# Patient Record
Sex: Female | Born: 1985 | Race: White | Hispanic: No | Marital: Single | Smoking: Former smoker
Health system: Southern US, Community
[De-identification: ages and names within clinical notes are randomized; demographics above are authoritative.]

---
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
## 2017-02-24 ENCOUNTER — Emergency Department (HOSPITAL_COMMUNITY): Payer: Self-pay

## 2017-02-24 ENCOUNTER — Encounter (HOSPITAL_COMMUNITY): Payer: Self-pay | Admitting: Emergency Medicine

## 2017-02-24 ENCOUNTER — Emergency Department (HOSPITAL_COMMUNITY)
Admission: EM | Admit: 2017-02-24 | Discharge: 2017-02-24 | Disposition: A | Discharge status: Home / Self Care | Payer: Self-pay | Attending: Emergency Medicine | Admitting: Emergency Medicine

## 2017-02-24 DIAGNOSIS — N739 Female pelvic inflammatory disease, unspecified: Secondary | ICD-10-CM | POA: Insufficient documentation

## 2017-02-24 DIAGNOSIS — N730 Acute parametritis and pelvic cellulitis: Secondary | ICD-10-CM

## 2017-02-24 DIAGNOSIS — Z87891 Personal history of nicotine dependence: Secondary | ICD-10-CM | POA: Insufficient documentation

## 2017-02-24 LAB — COMPREHENSIVE METABOLIC PANEL
ALT: 22 U/L (ref 14–54)
ANION GAP: 6 (ref 5–15)
AST: 20 U/L (ref 15–41)
Albumin: 3.9 g/dL (ref 3.5–5.0)
Alkaline Phosphatase: 57 U/L (ref 38–126)
BILIRUBIN TOTAL: 0.3 mg/dL (ref 0.3–1.2)
BUN: 11 mg/dL (ref 6–20)
CALCIUM: 9.1 mg/dL (ref 8.9–10.3)
CO2: 29 mmol/L (ref 22–32)
Chloride: 103 mmol/L (ref 101–111)
Creatinine, Ser: 0.81 mg/dL (ref 0.44–1.00)
GFR calc non Af Amer: >60 mL/min (ref >60)
Glucose, Bld: 106 mg/dL — ABNORMAL HIGH (ref 65–99)
POTASSIUM: 3.9 mmol/L (ref 3.5–5.1)
SODIUM: 138 mmol/L (ref 135–145)
TOTAL PROTEIN: 7.1 g/dL (ref 6.5–8.1)

## 2017-02-24 LAB — WET PREP, GENITAL
Sperm: NONE SEEN
Trich, Wet Prep: NONE SEEN
Yeast Wet Prep HPF POC: NONE SEEN

## 2017-02-24 LAB — CBC
HCT: 40.1 % (ref 36.0–46.0)
HEMOGLOBIN: 13 g/dL (ref 12.0–15.0)
MCH: 30.1 pg (ref 26.0–34.0)
MCHC: 32.4 g/dL (ref 30.0–36.0)
MCV: 92.8 fL (ref 78.0–100.0)
Platelets: 320 10*3/uL (ref 150–400)
RBC: 4.32 MIL/uL (ref 3.87–5.11)
RDW: 12.2 % (ref 11.5–15.5)
WBC: 11.6 10*3/uL — ABNORMAL HIGH (ref 4.0–10.5)

## 2017-02-24 LAB — URINALYSIS, ROUTINE W REFLEX MICROSCOPIC
BILIRUBIN URINE: NEGATIVE
GLUCOSE, UA: NEGATIVE mg/dL
KETONES UR: NEGATIVE mg/dL
LEUKOCYTES UA: NEGATIVE
NITRITE: NEGATIVE
PROTEIN: NEGATIVE mg/dL
Specific Gravity, Urine: 1.011 (ref 1.005–1.030)
pH: 8 (ref 5.0–8.0)

## 2017-02-24 LAB — LIPASE, BLOOD: Lipase: 27 U/L (ref 11–51)

## 2017-02-24 LAB — POC URINE PREG, ED: Preg Test, Ur: NEGATIVE

## 2017-02-24 MED ORDER — METRONIDAZOLE 500 MG PO TABS
500.0000 mg | ORAL_TABLET | Freq: Once | ORAL | Status: AC
  Administered 2017-02-24: 500 mg via ORAL
  Filled 2017-02-24: qty 1

## 2017-02-24 MED ORDER — DOXYCYCLINE HYCLATE 100 MG PO TABS
100.0000 mg | ORAL_TABLET | Freq: Once | ORAL | Status: AC
  Administered 2017-02-24: 100 mg via ORAL
  Filled 2017-02-24: qty 1

## 2017-02-24 MED ORDER — METRONIDAZOLE 500 MG PO TABS: 500.0000 mg | ORAL_TABLET | Freq: Two times a day (BID) | ORAL | 0 refills | Status: AC

## 2017-02-24 MED ORDER — NAPROXEN 500 MG PO TABS: 500.0000 mg | ORAL_TABLET | Freq: Two times a day (BID) | ORAL | 0 refills | Status: AC

## 2017-02-24 MED ORDER — KETOROLAC TROMETHAMINE 30 MG/ML IJ SOLN
30.0000 mg | Freq: Once | INTRAMUSCULAR | Status: AC
  Administered 2017-02-24: 30 mg via INTRAVENOUS
  Filled 2017-02-24: qty 1

## 2017-02-24 MED ORDER — LIDOCAINE HCL (PF) 1 % IJ SOLN
INTRAMUSCULAR | Status: AC
  Administered 2017-02-24: 1 mL
  Filled 2017-02-24: qty 5

## 2017-02-24 MED ORDER — CEFTRIAXONE SODIUM 250 MG IJ SOLR
250.0000 mg | Freq: Once | INTRAMUSCULAR | Status: AC
  Administered 2017-02-24: 250 mg via INTRAMUSCULAR
  Filled 2017-02-24: qty 250

## 2017-02-24 MED ORDER — DOXYCYCLINE HYCLATE 100 MG PO CAPS: 100.0000 mg | ORAL_CAPSULE | Freq: Two times a day (BID) | ORAL | 0 refills | Status: AC

## 2017-02-24 NOTE — ED Triage Notes (Addendum)
Patient c/o lower abd pain x3 weeks. Per patient nausea and vomiting. Denies any fevers, vaginal bleeding, or diarrhea. Patient does report frequent urination. Per patient last BM this morning-no blood noted. Per patient just had menstruation in which she states "was more spotting." Patient does state that she is sexual active.

## 2017-02-24 NOTE — ED Provider Notes (Signed)
[facility] DEPT Provider Note   CSN: [id number] Arrival date & time: 02/24/17  1145     History   Chief Complaint Chief Complaint  Patient presents with  . Abdominal Pain    HPI Karen Dunlap is a 31 y.o. female.  HPI  31 year old female presents with a chief complaint of abdominal pain. She states his been going on for 3 weeks. The pain seems to come and go and is severe and sharp when it occurs. It is mostly lower abdominal that she has a hard time localizing it. Some nausea but no vomiting. She denies any dysuria. She states a week after this started she developed some small spotting that was different than her typical menstrual cycle. She states she has irregular cycles. She has had a negative pregnancy test at [facility] as well as at home multiple times. No current vaginal bleeding or discharge. She also complains of chest pain that is diffuse with associated shortness of breath for the last 3 or 4 days. This is, on with a headache and dizziness as well.  History reviewed. No pertinent past medical history.  There are no active problems to display for this patient.   Past Surgical History:  Procedure Laterality Date  . CESAREAN SECTION  2008  . CYSTECTOMY  2008    OB History    No data available       Home Medications    Prior to Admission medications   Medication Sig Start Date End Date Taking? Authorizing Provider  doxycycline (VIBRAMYCIN) 100 MG capsule Take 1 capsule (100 mg total) by mouth 2 (two) times daily. 02/24/17 03/10/17  Pricilla Loveless, MD  metroNIDAZOLE (FLAGYL) 500 MG tablet Take 1 tablet (500 mg total) by mouth 2 (two) times daily. One po bid x 7 days 02/24/17   Pricilla Loveless, MD  naproxen (NAPROSYN) 500 MG tablet Take 1 tablet (500 mg total) by mouth 2 (two) times daily with a meal. 02/24/17   Pricilla Loveless, MD    Family History No family history on file.  Social History Social History  Substance Use Topics  . Smoking status:  Former Smoker    Quit date: 2016  . Smokeless tobacco: Never Used  . Alcohol use Yes     Allergies   Pineapple and Orange fruit [citrus]   Review of Systems Review of Systems  Constitutional: Negative for fever.  Respiratory: Positive for shortness of breath.   Cardiovascular: Positive for chest pain.  Gastrointestinal: Positive for abdominal pain. Negative for diarrhea and vomiting.  Genitourinary: Positive for menstrual problem. Negative for dysuria, vaginal bleeding, vaginal discharge and vaginal pain.  Neurological: Positive for dizziness and headaches.  All other systems reviewed and are negative.    Physical Exam Updated Vital Signs BP 112/78   Pulse (!) 58   Temp 97.9 F (36.6 C) (Oral)   Resp 18   Ht 5\' 1"  (1.549 m)   Wt 79.4 kg (175 lb)   LMP 02/14/2017   SpO2 99%   BMI 33.07 kg/m   Physical Exam  Constitutional: She is oriented to person, place, and time. She appears well-developed and well-nourished. No distress.  HENT:  Head: Normocephalic and atraumatic.  Right Ear: External ear normal.  Left Ear: External ear normal.  Nose: Nose normal.  Eyes: Right eye exhibits no discharge. Left eye exhibits no discharge.  Cardiovascular: Normal rate, regular rhythm and normal heart sounds.   Pulmonary/Chest: Effort normal and breath sounds normal. She exhibits tenderness (superior/anterior).  Abdominal:  Soft. There is tenderness in the right lower quadrant, suprapubic area and left lower quadrant.  Genitourinary: Uterus is tender. Vaginal discharge (mild, mixed with blood) found.  Genitourinary Comments: Mild vaginal discharge. Unable to visualize cervix on exam. Diffuse tenderness on bimanual exam, unable to feel ovaries.   Neurological: She is alert and oriented to person, place, and time.  Skin: Skin is warm and dry. She is not diaphoretic.  Nursing note and vitals reviewed.    ED Treatments / Results  Labs (all labs ordered are listed, but only abnormal  results are displayed) Labs Reviewed  WET PREP, GENITAL - Abnormal; Notable for the following:       Result Value   Clue Cells Wet Prep HPF POC PRESENT (*)    WBC, Wet Prep HPF POC FEW (*)    All other components within normal limits  COMPREHENSIVE METABOLIC PANEL - Abnormal; Notable for the following:    Glucose, Bld 106 (*)    All other components within normal limits  CBC - Abnormal; Notable for the following:    WBC 11.6 (*)    All other components within normal limits  URINALYSIS, ROUTINE W REFLEX MICROSCOPIC - Abnormal; Notable for the following:    APPearance HAZY (*)    Hgb urine dipstick SMALL (*)    Bacteria, UA RARE (*)    Squamous Epithelial / LPF 0-5 (*)    All other components within normal limits  LIPASE, BLOOD  POC URINE PREG, ED  GC/CHLAMYDIA PROBE AMP ([employer]) NOT AT [facility]    EKG  EKG Interpretation  Date/Time:  Wednesday February 24 2017 13:19:00 EDT Ventricular Rate:  60 PR Interval:    QRS Duration: 78 QT Interval:  394 QTC Calculation: 394 R Axis:   45 Text Interpretation:  Sinus rhythm Low voltage, precordial leads No old tracing to compare Confirmed by Pricilla Loveless [phone]) on 02/24/2017 1:30:11 PM       Radiology Dg Chest 2 View  Result Date: 02/24/2017 CLINICAL DATA:  Abdominal pain with nausea and vomiting for 3 weeks. EXAM: CHEST  2 VIEW COMPARISON:  None. FINDINGS: The lungs are clear. Heart size is normal. No pneumothorax or pleural fluid. No bony abnormality. IMPRESSION: Negative chest. Electronically Signed   By: Drusilla Kanner M.D.   On: 02/24/2017 14:43   US Transvaginal Non-ob  Result Date: 02/24/2017 CLINICAL DATA:  Pelvic pain for 3 weeks. EXAM: TRANSABDOMINAL AND TRANSVAGINAL ULTRASOUND OF PELVIS DOPPLER ULTRASOUND OF OVARIES TECHNIQUE: Both transabdominal and transvaginal ultrasound examinations of the pelvis were performed. Transabdominal technique was performed for global imaging of the pelvis including uterus, ovaries, adnexal  regions, and pelvic cul-de-sac. It was necessary to proceed with endovaginal exam following the transabdominal exam to visualize the perfusion to the ovaries. Color and duplex Doppler ultrasound was utilized to evaluate blood flow to the ovaries. COMPARISON:  None. FINDINGS: Uterus Measurements: 11.1 x 4.1 x 5.1 cm. No fibroids or other mass visualized. Endometrium Thickness: 6.7 mm.  No focal abnormality visualized. Right ovary Measurements: 3.9 x 2.3 x 3.0 cm. Normal appearance/no adnexal mass. Left ovary Measurements: 3.2 x 2.6 x 3.6 cm. Normal appearance/no adnexal mass. Pulsed Doppler evaluation of both ovaries demonstrates normal low-resistance arterial and venous waveforms. Other findings No abnormal free fluid. IMPRESSION: Normal exam.  Normal perfusion to both ovaries. Electronically Signed   By: Francene Boyers M.D.   On: 02/24/2017 15:16   US Pelvis Complete  Result Date: 02/24/2017 CLINICAL DATA:  Pelvic pain for 3  weeks. EXAM: TRANSABDOMINAL AND TRANSVAGINAL ULTRASOUND OF PELVIS DOPPLER ULTRASOUND OF OVARIES TECHNIQUE: Both transabdominal and transvaginal ultrasound examinations of the pelvis were performed. Transabdominal technique was performed for global imaging of the pelvis including uterus, ovaries, adnexal regions, and pelvic cul-de-sac. It was necessary to proceed with endovaginal exam following the transabdominal exam to visualize the perfusion to the ovaries. Color and duplex Doppler ultrasound was utilized to evaluate blood flow to the ovaries. COMPARISON:  None. FINDINGS: Uterus Measurements: 11.1 x 4.1 x 5.1 cm. No fibroids or other mass visualized. Endometrium Thickness: 6.7 mm.  No focal abnormality visualized. Right ovary Measurements: 3.9 x 2.3 x 3.0 cm. Normal appearance/no adnexal mass. Left ovary Measurements: 3.2 x 2.6 x 3.6 cm. Normal appearance/no adnexal mass. Pulsed Doppler evaluation of both ovaries demonstrates normal low-resistance arterial and venous waveforms. Other  findings No abnormal free fluid. IMPRESSION: Normal exam.  Normal perfusion to both ovaries. Electronically Signed   By: Francene Boyers M.D.   On: 02/24/2017 15:16   [place] Art/ven Flow Abd Pelv Doppler  Result Date: 02/24/2017 CLINICAL DATA:  Pelvic pain for 3 weeks. EXAM: TRANSABDOMINAL AND TRANSVAGINAL ULTRASOUND OF PELVIS DOPPLER ULTRASOUND OF OVARIES TECHNIQUE: Both transabdominal and transvaginal ultrasound examinations of the pelvis were performed. Transabdominal technique was performed for global imaging of the pelvis including uterus, ovaries, adnexal regions, and pelvic cul-de-sac. It was necessary to proceed with endovaginal exam following the transabdominal exam to visualize the perfusion to the ovaries. Color and duplex Doppler ultrasound was utilized to evaluate blood flow to the ovaries. COMPARISON:  None. FINDINGS: Uterus Measurements: 11.1 x 4.1 x 5.1 cm. No fibroids or other mass visualized. Endometrium Thickness: 6.7 mm.  No focal abnormality visualized. Right ovary Measurements: 3.9 x 2.3 x 3.0 cm. Normal appearance/no adnexal mass. Left ovary Measurements: 3.2 x 2.6 x 3.6 cm. Normal appearance/no adnexal mass. Pulsed Doppler evaluation of both ovaries demonstrates normal low-resistance arterial and venous waveforms. Other findings No abnormal free fluid. IMPRESSION: Normal exam.  Normal perfusion to both ovaries. Electronically Signed   By: Francene Boyers M.D.   On: 02/24/2017 15:16    Procedures Procedures (including critical care time)  Medications Ordered in ED Medications  ketorolac (TORADOL) 30 MG/ML injection 30 mg (30 mg Intravenous Given 02/24/17 1321)  cefTRIAXone (ROCEPHIN) injection 250 mg (250 mg Intramuscular Given 02/24/17 1359)  doxycycline (VIBRA-TABS) tablet 100 mg (100 mg Oral Given 02/24/17 1359)  lidocaine (PF) (XYLOCAINE) 1 % injection (1 mL  Given 02/24/17 1400)  metroNIDAZOLE (FLAGYL) tablet 500 mg (500 mg Oral Given 02/24/17 1503)     Initial Impression /  Assessment and Plan / ED Course  I have reviewed the triage vital signs and the nursing notes.  Pertinent labs & imaging results that were available during my care of the patient were reviewed by me and considered in my medical decision making (see chart for details).     Patient's abdominal pain appears to be pelvic in nature. Given the discharge seen and reproducible uterine tenderness is most consistent with PID. However she is afebrile and overall well-appearing with no vomiting. She was given IM Rocephin, oral doxycycline and oral metronidazole which will also be prescribed. NSAIDs and Tylenol for pain. She is resting comfortably here. Ultrasound was obtained to rule out TOA given length of symptoms and symptoms were worse on the left side. However this ultrasound is unremarkable. She is not pregnant. Unclear why she's having chest pain in addition to all this but it's reproducible and very atypical.  No further workup indicated currently. Follow-up with OB/GYN, discussed return precautions.   Final Clinical Impressions(s) / ED Diagnoses   Final diagnoses:  Acute PID (pelvic inflammatory disease)    New Prescriptions New Prescriptions   DOXYCYCLINE (VIBRAMYCIN) 100 MG CAPSULE    Take 1 capsule (100 mg total) by mouth 2 (two) times daily.   METRONIDAZOLE (FLAGYL) 500 MG TABLET    Take 1 tablet (500 mg total) by mouth 2 (two) times daily. One po bid x 7 days   NAPROXEN (NAPROSYN) 500 MG TABLET    Take 1 tablet (500 mg total) by mouth 2 (two) times daily with a meal.     Pricilla LovelessGoldston, Trenise Turay, MD 02/24/17 1531

## 2017-02-25 LAB — GC/CHLAMYDIA PROBE AMP (~~LOC~~) NOT AT ARMC
CHLAMYDIA, DNA PROBE: NEGATIVE
NEISSERIA GONORRHEA: NEGATIVE

## 2017-03-13 ENCOUNTER — Emergency Department (HOSPITAL_COMMUNITY)
Admission: EM | Admit: 2017-03-13 | Discharge: 2017-03-13 | Disposition: A | Discharge status: Home / Self Care | Payer: Self-pay | Attending: Emergency Medicine | Admitting: Emergency Medicine

## 2017-03-13 ENCOUNTER — Emergency Department (HOSPITAL_COMMUNITY): Payer: Self-pay

## 2017-03-13 ENCOUNTER — Encounter (HOSPITAL_COMMUNITY): Payer: Self-pay

## 2017-03-13 DIAGNOSIS — B373 Candidiasis of vulva and vagina: Secondary | ICD-10-CM | POA: Insufficient documentation

## 2017-03-13 DIAGNOSIS — R102 Pelvic and perineal pain: Secondary | ICD-10-CM | POA: Insufficient documentation

## 2017-03-13 DIAGNOSIS — B3731 Acute candidiasis of vulva and vagina: Secondary | ICD-10-CM

## 2017-03-13 LAB — COMPREHENSIVE METABOLIC PANEL
ALT: 20 U/L (ref 14–54)
ANION GAP: 5 (ref 5–15)
AST: 21 U/L (ref 15–41)
Albumin: 3.7 g/dL (ref 3.5–5.0)
Alkaline Phosphatase: 49 U/L (ref 38–126)
BUN: 6 mg/dL (ref 6–20)
CALCIUM: 9 mg/dL (ref 8.9–10.3)
CHLORIDE: 109 mmol/L (ref 101–111)
CO2: 27 mmol/L (ref 22–32)
Creatinine, Ser: 0.71 mg/dL (ref 0.44–1.00)
GFR calc non Af Amer: >60 mL/min (ref >60)
Glucose, Bld: 116 mg/dL — ABNORMAL HIGH (ref 65–99)
POTASSIUM: 4.1 mmol/L (ref 3.5–5.1)
SODIUM: 141 mmol/L (ref 135–145)
Total Bilirubin: 0.5 mg/dL (ref 0.3–1.2)
Total Protein: 6.9 g/dL (ref 6.5–8.1)

## 2017-03-13 LAB — CBC WITH DIFFERENTIAL/PLATELET
Basophils Absolute: 0.1 10*3/uL (ref 0.0–0.1)
Basophils Relative: 1 %
EOS ABS: 0.8 10*3/uL — AB (ref 0.0–0.7)
EOS PCT: 7 %
HCT: 39.5 % (ref 36.0–46.0)
Hemoglobin: 12.9 g/dL (ref 12.0–15.0)
LYMPHS ABS: 3.7 10*3/uL (ref 0.7–4.0)
Lymphocytes Relative: 31 %
MCH: 30 pg (ref 26.0–34.0)
MCHC: 32.7 g/dL (ref 30.0–36.0)
MCV: 91.9 fL (ref 78.0–100.0)
MONOS PCT: 11 %
Monocytes Absolute: 1.4 10*3/uL — ABNORMAL HIGH (ref 0.1–1.0)
Neutro Abs: 6.1 10*3/uL (ref 1.7–7.7)
Neutrophils Relative %: 50 %
PLATELETS: 292 10*3/uL (ref 150–400)
RBC: 4.3 MIL/uL (ref 3.87–5.11)
RDW: 12.5 % (ref 11.5–15.5)
WBC: 12 10*3/uL — ABNORMAL HIGH (ref 4.0–10.5)

## 2017-03-13 LAB — URINALYSIS, ROUTINE W REFLEX MICROSCOPIC
BILIRUBIN URINE: NEGATIVE
Bacteria, UA: NONE SEEN
GLUCOSE, UA: NEGATIVE mg/dL
Ketones, ur: NEGATIVE mg/dL
LEUKOCYTES UA: NEGATIVE
Nitrite: NEGATIVE
PH: 7 (ref 5.0–8.0)
PROTEIN: NEGATIVE mg/dL
Specific Gravity, Urine: 1.016 (ref 1.005–1.030)

## 2017-03-13 LAB — WET PREP, GENITAL
CLUE CELLS WET PREP: NONE SEEN
SPERM: NONE SEEN
Trich, Wet Prep: NONE SEEN

## 2017-03-13 LAB — POC URINE PREG, ED: Preg Test, Ur: NEGATIVE

## 2017-03-13 MED ORDER — KETOROLAC TROMETHAMINE 60 MG/2ML IM SOLN
30.0000 mg | Freq: Once | INTRAMUSCULAR | Status: AC
  Administered 2017-03-13: 30 mg via INTRAMUSCULAR
  Filled 2017-03-13: qty 2

## 2017-03-13 MED ORDER — FLUCONAZOLE 200 MG PO TABS: 200.0000 mg | ORAL_TABLET | Freq: Once | ORAL | 0 refills | Status: AC

## 2017-03-13 NOTE — Discharge Instructions (Signed)
Your ultrasound was reassuring. You have yeast infection. Please take antifungal medications as prescribed. He was also given referral to gynecology for follow-up for ongoing workup and management of her pelvic pain. Please return for worsening symptoms, including fever, intractable vomiting, escalating pain, or any other symptoms concerning to.

## 2017-03-13 NOTE — ED Triage Notes (Signed)
Mid lower abdominal pain x 1.5 month ago. Has apt 03/16/17 states can not wait due to pain. Has diarrhea intermittent.

## 2017-03-13 NOTE — ED Provider Notes (Signed)
[facility] DE[facility] Provider Note   CSN: [id number] Arrival date & time: 03/13/17  1240     History   Chief Complaint Chief Complaint  Patient presents with  . Abdominal Pain    HPI Karen Dunlap is a 31 y.o. female.  HPI 31 year old female who presents with pelvic pain. History of cesarean section and cystectomy. Lungs 2-3 months of intermittent pelvic pain, gradually worsening. Last menstrual period was one month ago, and states that pain seems to worsen around time of menses. Denies any fever, chills, nausea or vomiting, diarrhea, constipation, dysuria. Does note intermittent urinary frequency and urgency. Taking ibuprofen, with mild improvement in symptoms. Also was treated for PID one month ago, but symptoms did not improve.  History reviewed. No pertinent past medical history.  There are no active problems to display for this patient.   Past Surgical History:  Procedure Laterality Date  . CESAREAN SECTION  2008  . CYSTECTOMY  2008    OB History    No data available       Home Medications    Prior to Admission medications   Medication Sig Start Date End Date Taking? Authorizing Provider  doxycycline (VIBRA-TABS) 100 MG tablet Take 100 mg by mouth 2 (two) times daily. 02/24/17  Yes [provider]  fluconazole (DIFLUCAN) 200 MG tablet Take 1 tablet (200 mg total) by mouth once. If persistent symptoms in 72 hours, may repeat second dose 03/13/17 03/13/17  Lavera Guise, MD  metroNIDAZOLE (FLAGYL) 500 MG tablet Take 1 tablet (500 mg total) by mouth 2 (two) times daily. One po bid x 7 days Patient not taking: Reported on 03/13/2017 02/24/17   Pricilla Loveless, MD  naproxen (NAPROSYN) 500 MG tablet Take 1 tablet (500 mg total) by mouth 2 (two) times daily with a meal. Patient not taking: Reported on 03/13/2017 02/24/17   Pricilla Loveless, MD    Family History No family history on file.  Social History Social History  Substance Use Topics  . Smoking status: Former  Smoker    Quit date: 2016  . Smokeless tobacco: Never Used  . Alcohol use Yes     Allergies   Pineapple and Orange fruit [citrus]   Review of Systems Review of Systems  Constitutional: Negative for fever.  Gastrointestinal: Negative for diarrhea, nausea and vomiting.  Genitourinary: Positive for pelvic pain. Negative for dysuria.  Allergic/Immunologic: Negative for immunocompromised state.  Hematological: Does not bruise/bleed easily.  All other systems reviewed and are negative.    Physical Exam Updated Vital Signs BP 120/73 (BP Location: Right Arm)   Pulse 67   Temp 98.2 F (36.8 C) (Oral)   Resp 18   Ht 5\' 1"  (1.549 m)   Wt 79.4 kg (175 lb)   LMP 02/19/2017   SpO2 100%   BMI 33.07 kg/m   Physical Exam Physical Exam  Nursing note and vitals reviewed. Constitutional: Well developed, well nourished, non-toxic, and in no acute distress Head: Normocephalic and atraumatic.  Mouth/Throat: Oropharynx is clear and moist.  Neck: Normal range of motion. Neck supple.  Cardiovascular: Normal rate and regular rhythm.   Pulmonary/Chest: Effort normal and breath sounds normal.  Abdominal: Soft. There is low pelvic tenderness. There is no rebound and no guarding.  Pelvic: Normal external genitalia. Normal internal genitalia. Copious thick white discharge. No blood within the vagina. No cervical motion tenderness. No adnexal masses. Left adnexal tenderness. Musculoskeletal: Normal range of motion.  Neurological: Alert, no facial droop, fluent speech, moves all extremities  symmetrically Skin: Skin is warm and dry.  Psychiatric: Cooperative   ED Treatments / Results  Labs (all labs ordered are listed, but only abnormal results are displayed) Labs Reviewed  WET PREP, GENITAL - Abnormal; Notable for the following:       Result Value   Yeast Wet Prep HPF POC PRESENT (*)    WBC, Wet Prep HPF POC FEW (*)    All other components within normal limits  URINALYSIS, ROUTINE W  REFLEX MICROSCOPIC - Abnormal; Notable for the following:    APPearance HAZY (*)    Hgb urine dipstick SMALL (*)    Squamous Epithelial / LPF 0-5 (*)    All other components within normal limits  CBC WITH DIFFERENTIAL/PLATELET - Abnormal; Notable for the following:    WBC 12.0 (*)    Monocytes Absolute 1.4 (*)    Eosinophils Absolute 0.8 (*)    All other components within normal limits  COMPREHENSIVE METABOLIC PANEL - Abnormal; Notable for the following:    Glucose, Bld 116 (*)    All other components within normal limits  POC URINE PREG, ED  GC/CHLAMYDIA PROBE AMP ([place]) NOT AT [facility]    EKG  EKG Interpretation None       Radiology [place] Transvaginal Non-ob  Result Date: 03/13/2017 CLINICAL DATA:  Chronic pain for approximately 6 weeks EXAM: TRANSABDOMINAL AND TRANSVAGINAL ULTRASOUND OF PELVIS DOPPLER ULTRASOUND OF OVARIES TECHNIQUE: Study was performed transabdominally to optimize pelvic field of view evaluation and transvaginally to optimize internal visceral architecture evaluation. Color and duplex Doppler ultrasound was utilized to evaluate blood flow to the ovaries. COMPARISON:  February 24, 2017 FINDINGS: Uterus Measurements: 10.2 x 5.2 x 5.3 cm. No fibroids or other mass visualized. Uterus is anteverted. Endometrium Thickness: 8 mm.  No focal abnormality visualized. Right ovary Measurements: 2.8 x 2.7 x 3.0 cm. Normal appearance/no adnexal mass. Left ovary Measurements: 2.2 x 2.1 x 2.2 cm. Normal appearance/no adnexal mass. Pulsed Doppler evaluation of both ovaries demonstrates normal low-resistance arterial and venous waveforms. Other findings No abnormal free fluid. IMPRESSION: No intrauterine or extrauterine pelvic or adnexal mass. No inflammatory focus. No free fluid. No evidence of ovarian torsion. Electronically Signed   By: Bretta BangWilliam  Woodruff III M.D.   On: 03/13/2017 14:04   [place] Pelvis Complete  Result Date: 03/13/2017 CLINICAL DATA:  Chronic pain for approximately 6 weeks  EXAM: TRANSABDOMINAL AND TRANSVAGINAL ULTRASOUND OF PELVIS DOPPLER ULTRASOUND OF OVARIES TECHNIQUE: Study was performed transabdominally to optimize pelvic field of view evaluation and transvaginally to optimize internal visceral architecture evaluation. Color and duplex Doppler ultrasound was utilized to evaluate blood flow to the ovaries. COMPARISON:  February 24, 2017 FINDINGS: Uterus Measurements: 10.2 x 5.2 x 5.3 cm. No fibroids or other mass visualized. Uterus is anteverted. Endometrium Thickness: 8 mm.  No focal abnormality visualized. Right ovary Measurements: 2.8 x 2.7 x 3.0 cm. Normal appearance/no adnexal mass. Left ovary Measurements: 2.2 x 2.1 x 2.2 cm. Normal appearance/no adnexal mass. Pulsed Doppler evaluation of both ovaries demonstrates normal low-resistance arterial and venous waveforms. Other findings No abnormal free fluid. IMPRESSION: No intrauterine or extrauterine pelvic or adnexal mass. No inflammatory focus. No free fluid. No evidence of ovarian torsion. Electronically Signed   By: Bretta BangWilliam  Woodruff III M.D.   On: 03/13/2017 14:04   [place] Art/ven Flow Abd Pelv Doppler  Result Date: 03/13/2017 CLINICAL DATA:  Chronic pain for approximately 6 weeks EXAM: TRANSABDOMINAL AND TRANSVAGINAL ULTRASOUND OF PELVIS DOPPLER ULTRASOUND OF OVARIES TECHNIQUE: Study was  performed transabdominally to optimize pelvic field of view evaluation and transvaginally to optimize internal visceral architecture evaluation. Color and duplex Doppler ultrasound was utilized to evaluate blood flow to the ovaries. COMPARISON:  February 24, 2017 FINDINGS: Uterus Measurements: 10.2 x 5.2 x 5.3 cm. No fibroids or other mass visualized. Uterus is anteverted. Endometrium Thickness: 8 mm.  No focal abnormality visualized. Right ovary Measurements: 2.8 x 2.7 x 3.0 cm. Normal appearance/no adnexal mass. Left ovary Measurements: 2.2 x 2.1 x 2.2 cm. Normal appearance/no adnexal mass. Pulsed Doppler evaluation of both ovaries demonstrates  normal low-resistance arterial and venous waveforms. Other findings No abnormal free fluid. IMPRESSION: No intrauterine or extrauterine pelvic or adnexal mass. No inflammatory focus. No free fluid. No evidence of ovarian torsion. Electronically Signed   By: Bretta Bang III M.D.   On: 03/13/2017 14:04    Procedures Procedures (including critical care time)  Medications Ordered in ED Medications  ketorolac (TORADOL) injection 30 mg (30 mg Intramuscular Given 03/13/17 1359)     Initial Impression / Assessment and Plan / ED Course  I have reviewed the triage vital signs and the nursing notes.  Pertinent labs & imaging results that were available during my care of the patient were reviewed by me and considered in my medical decision making (see chart for details).     31 year old female who presents with persistent pelvic pain over the past 2-3 months. Records reviewed, was treated for PID month ago, but with subsequent negative STD testing. Her abdomen is soft and benign.. Vital signs are stable. Pain primarily localizing to the left adnexa and suprapubic area. Pelvic exam without significant cervical motion tenderness, but a moderate left adnexal tenderness noted. Pelvic ultrasound reveals no evidence of torsion, TOA, cyst, or other acute intrapelvic processes. Wet prep is notable for yeast, which she will be given course of Diflucan. At this time and not suspicious for serious intra-abdominal or intrapelvic processes. Given ongoing symptoms is referred to gynecology for ongoing management and workup. Strict return and follow-up instructions reviewed. She expressed understanding of all discharge instructions and felt comfortable with the plan of care.   Final Clinical Impressions(s) / ED Diagnoses   Final diagnoses:  Pelvic pain  Yeast vaginitis    New Prescriptions New Prescriptions   FLUCONAZOLE (DIFLUCAN) 200 MG TABLET    Take 1 tablet (200 mg total) by mouth once. If persistent  symptoms in 72 hours, may repeat second dose     Lavera Guise, MD 03/13/17 [phone]

## 2017-03-15 LAB — GC/CHLAMYDIA PROBE AMP (~~LOC~~) NOT AT ARMC
CHLAMYDIA, DNA PROBE: NEGATIVE
NEISSERIA GONORRHEA: NEGATIVE

## 2019-04-01 IMAGING — US US ART/VEN ABD/PELV/SCROTUM DOPPLER LTD
1 series · 14 of 25 positions shown · non-contrast
Comparison: None.

CLINICAL DATA: Pelvic pain for 3 weeks.

EXAM:
TRANSABDOMINAL AND TRANSVAGINAL ULTRASOUND OF PELVIS
DOPPLER ULTRASOUND OF OVARIES
TECHNIQUE: Both transabdominal and transvaginal ultrasound examinations of the
pelvis were performed. Transabdominal technique was performed for
global imaging of the pelvis including uterus, ovaries, adnexal
regions, and pelvic cul-de-sac.
It was necessary to proceed with endovaginal exam following the
transabdominal exam to visualize the perfusion to the ovaries. Color
and duplex Doppler ultrasound was utilized to evaluate blood flow to
the ovaries.

[Series 1: us art/ven abd/pelv/scrotum doppler ltd · 0.18mm/px · 14 of 84 slices shown]
[im 1/84]
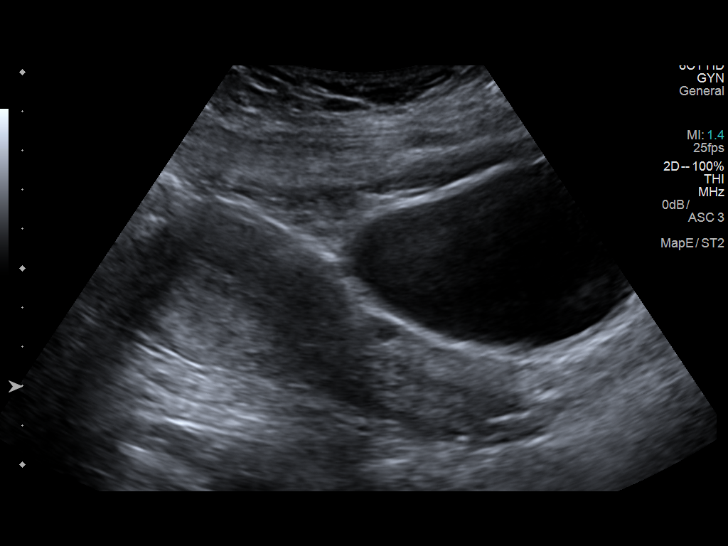
[im 7/84]
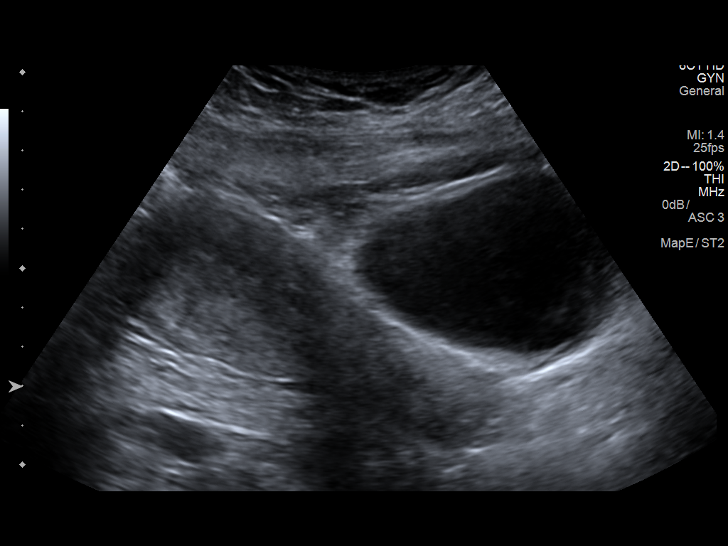
[im 14/84]
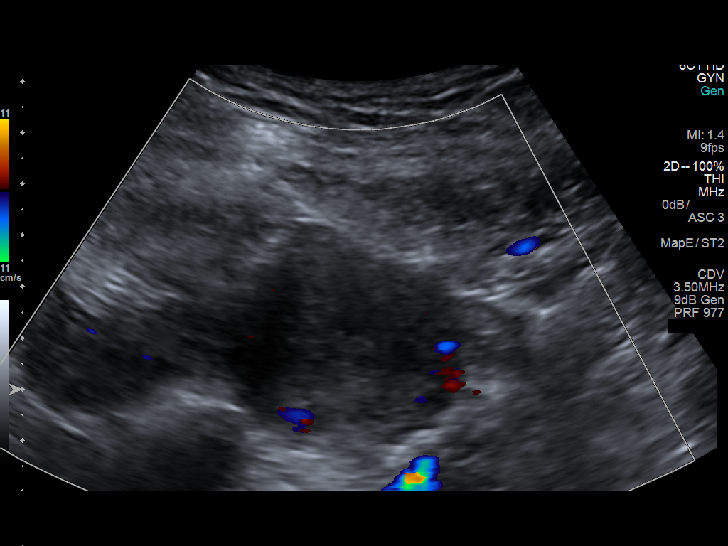
[im 21/84]
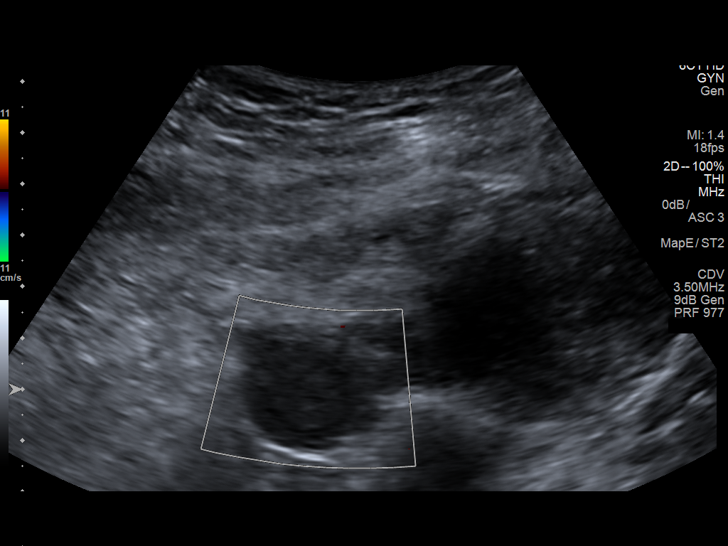
[im 28/84]
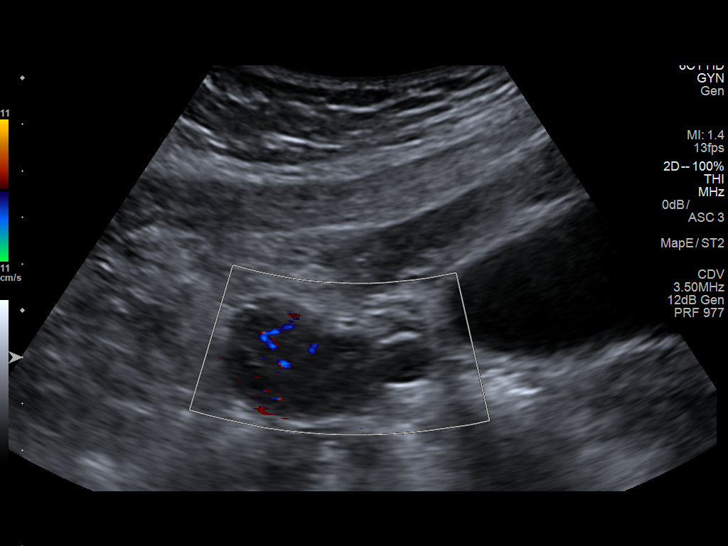
[im 32/84]
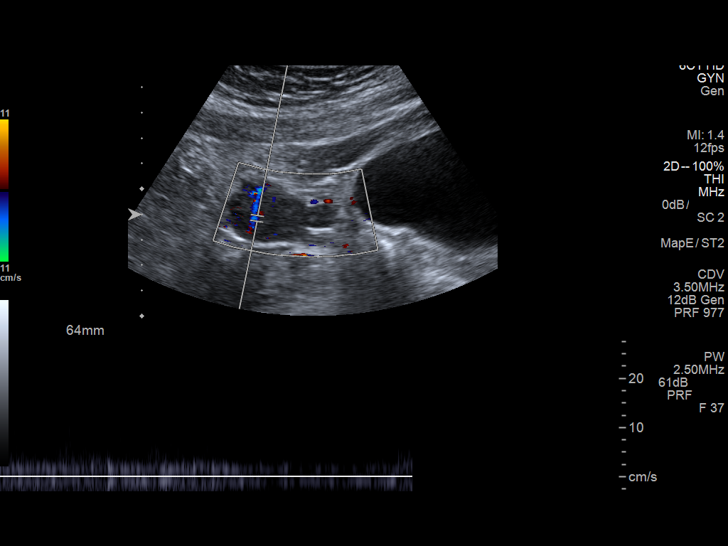
[im 39/84]
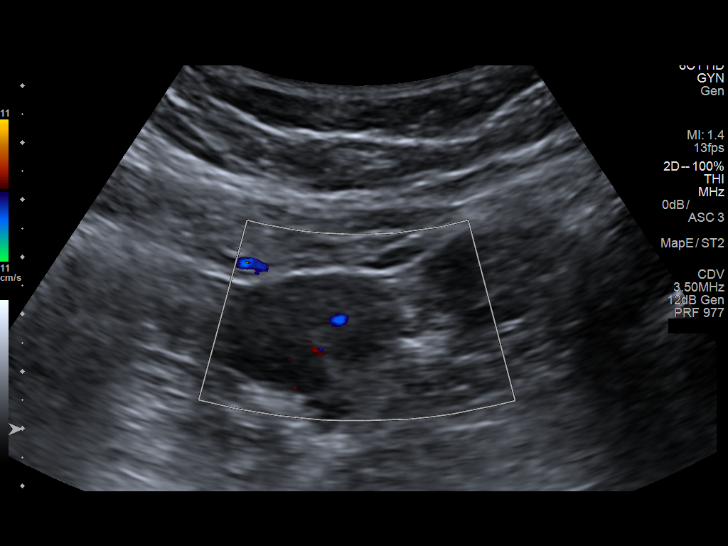
[im 45/84]
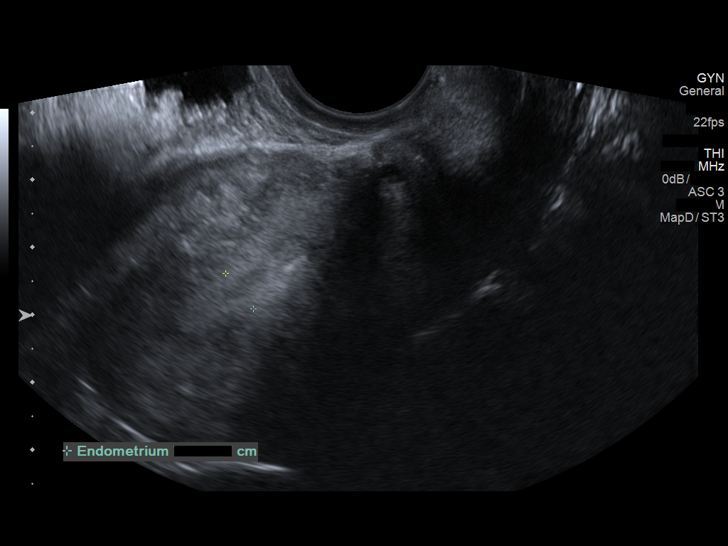
[im 52/84]
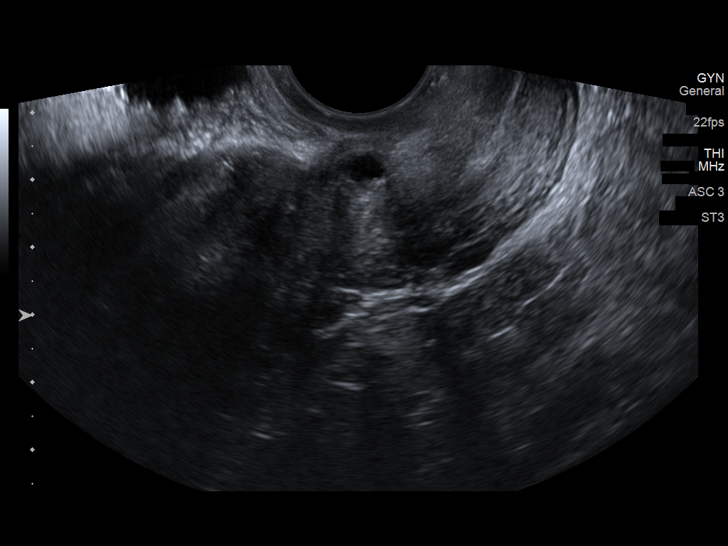
[im 56/84]
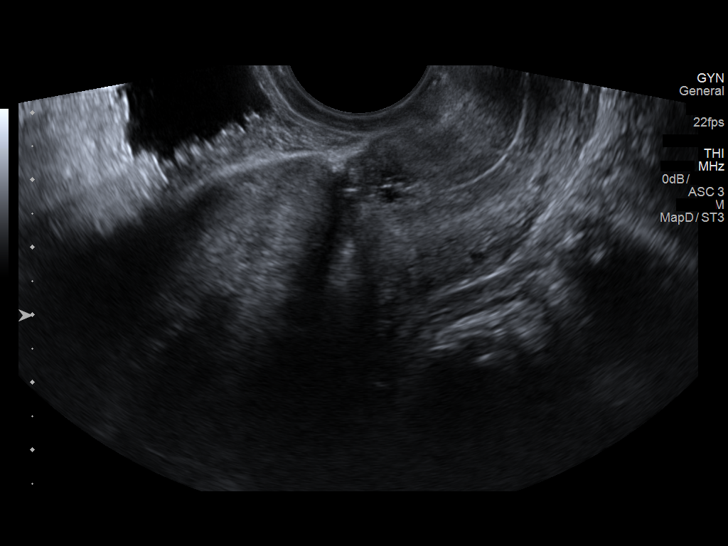
[im 63/84]
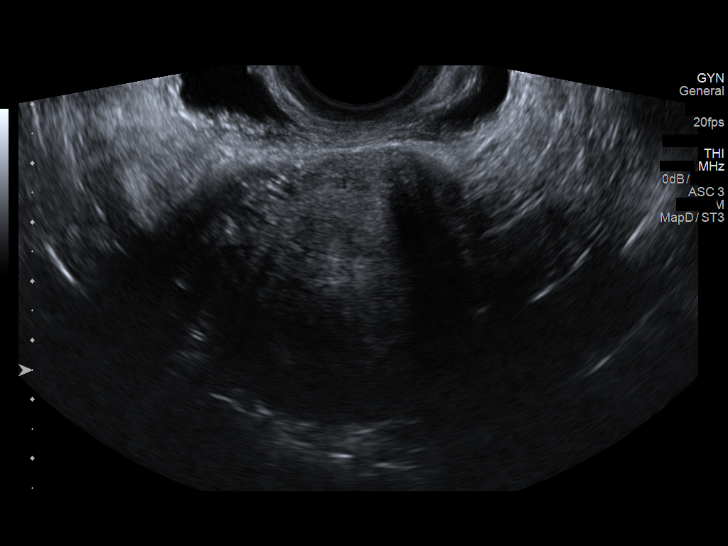
[im 70/84]
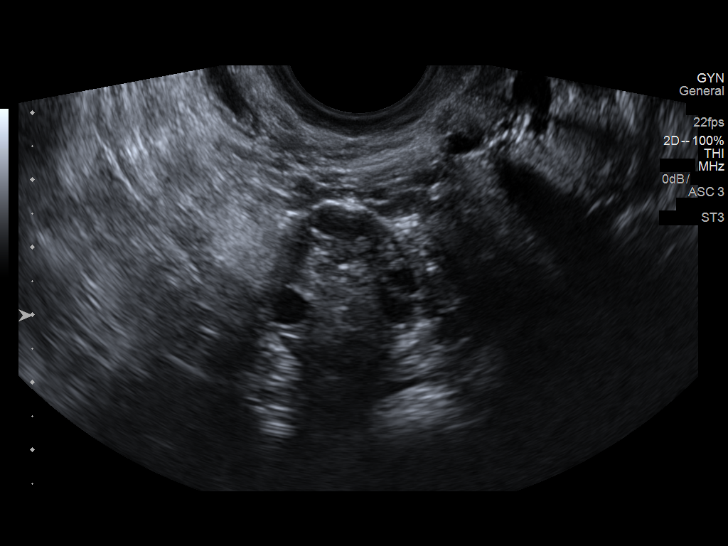
[im 77/84]
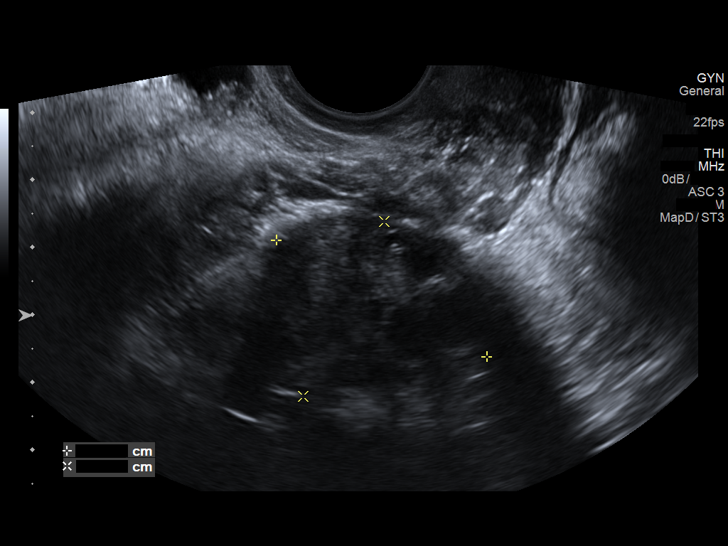
[im 84/84]
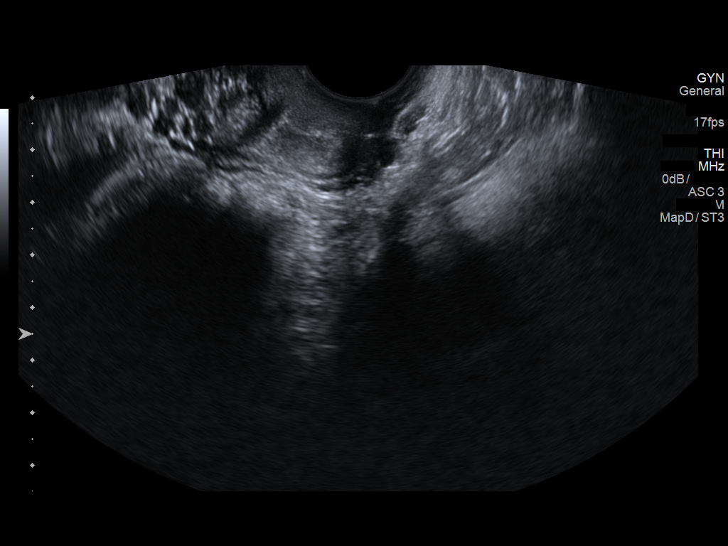

[14 of 25 positions shown; findings below may reference images not displayed]

FINDINGS: Uterus

Measurements: 11.1 x 4.1 x 5.1 cm. No fibroids or other mass
visualized.

Endometrium

Thickness: 6.7 mm.  No focal abnormality visualized.

Right ovary

Measurements: 3.9 x 2.3 x 3.0 cm. Normal appearance/no adnexal mass.

Left ovary

Measurements: 3.2 x 2.6 x 3.6 cm. Normal appearance/no adnexal mass.

Pulsed Doppler evaluation of both ovaries demonstrates normal
low-resistance arterial and venous waveforms.

Other findings

No abnormal free fluid.
IMPRESSION: Normal exam.  Normal perfusion to both ovaries.

## 2019-04-18 IMAGING — US US ART/VEN ABD/PELV/SCROTUM DOPPLER LTD
1 series · 14 of 25 positions shown · non-contrast
Comparison: February 24, 2017

CLINICAL DATA: Chronic pain for approximately 6 weeks

EXAM:
TRANSABDOMINAL AND TRANSVAGINAL ULTRASOUND OF PELVIS
DOPPLER ULTRASOUND OF OVARIES
TECHNIQUE: Study was performed transabdominally to optimize pelvic field of
view evaluation and transvaginally to optimize internal visceral
architecture evaluation.
Color and duplex Doppler ultrasound was utilized to evaluate blood
flow to the ovaries.

[Series 1: us art/ven abd/pelv/scrotum doppler ltd · 0.27mm/px · 14 of 67 slices shown]
[im 1/67]
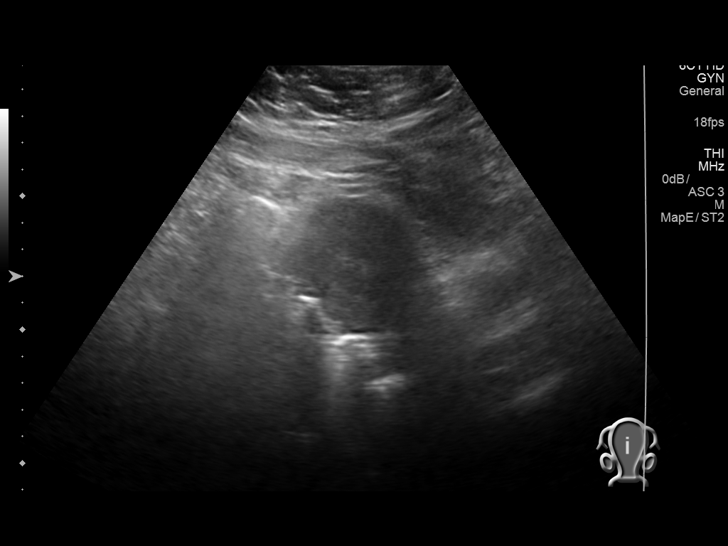
[im 6/67]
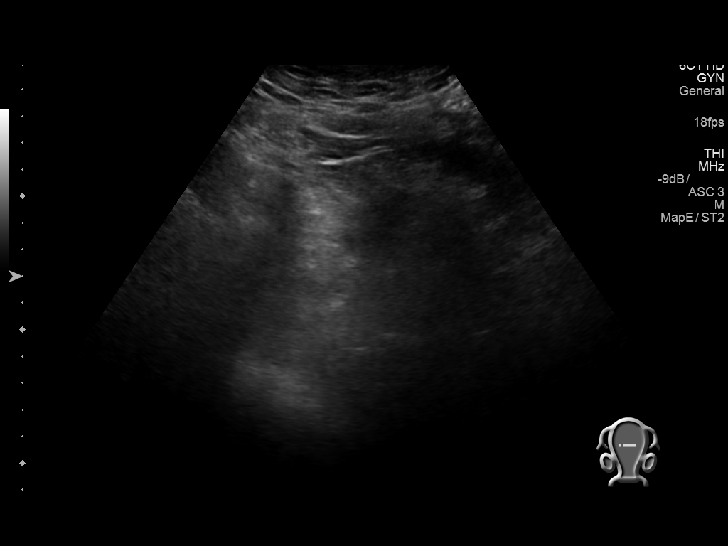
[im 12/67]
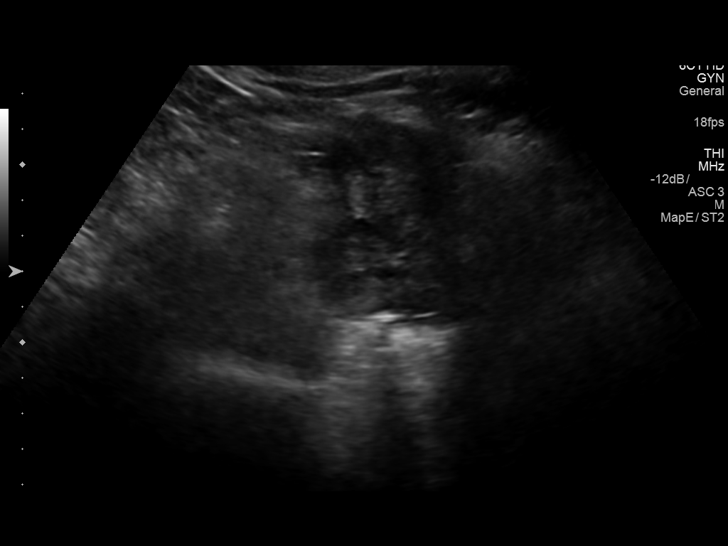
[im 17/67]
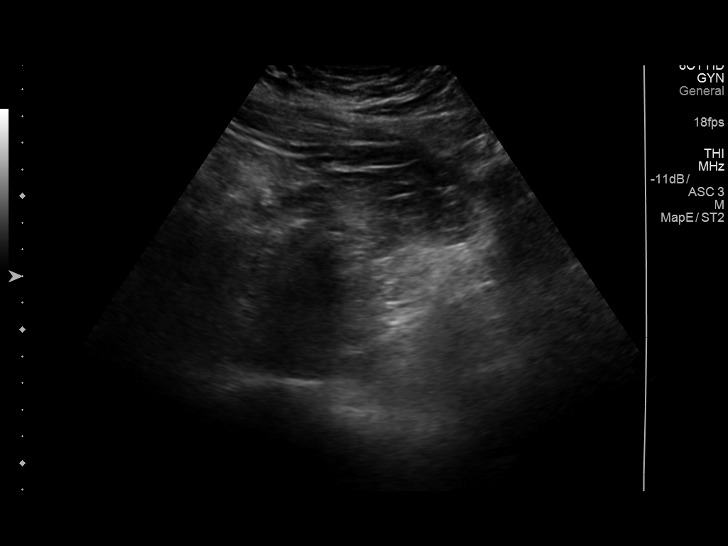
[im 23/67]
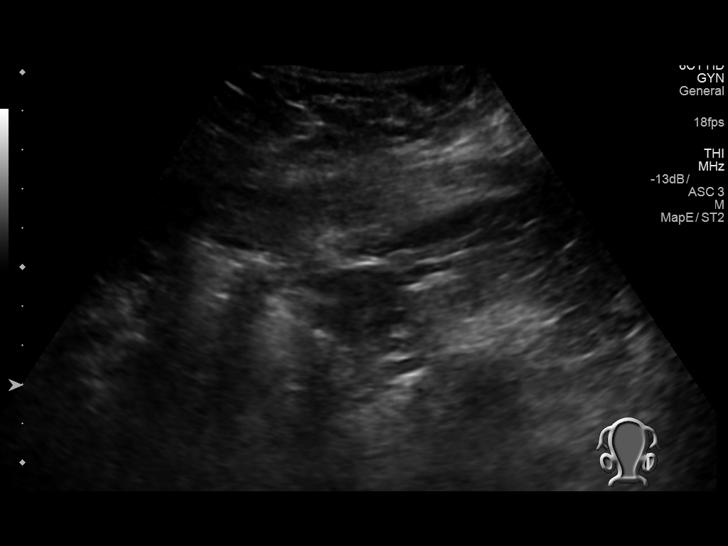
[im 25/67]
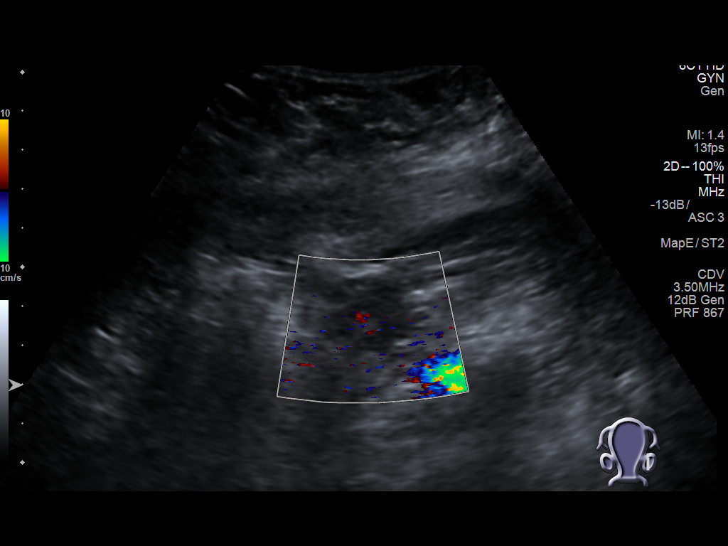
[im 31/67]
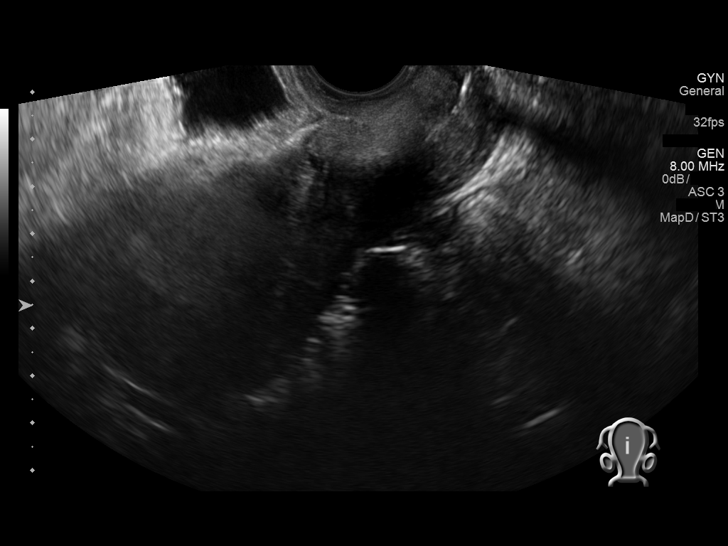
[im 36/67]
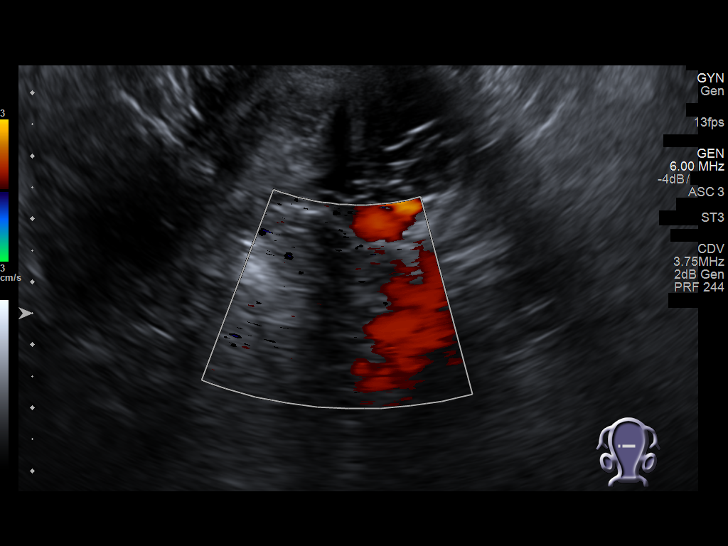
[im 42/67]
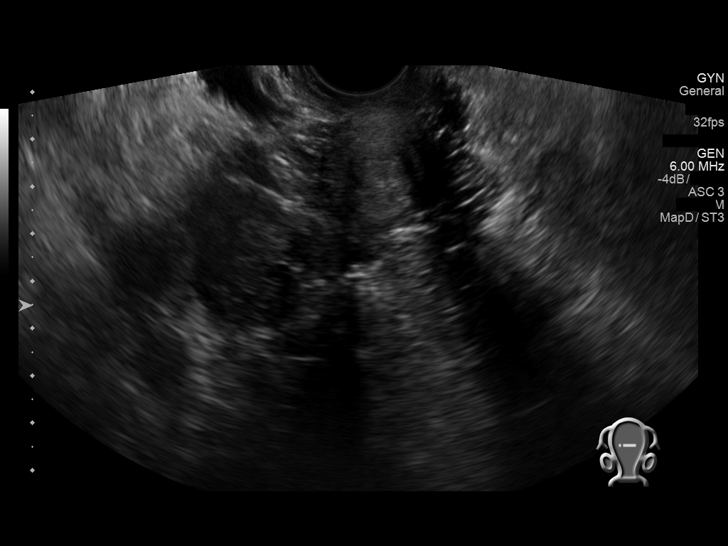
[im 45/67]
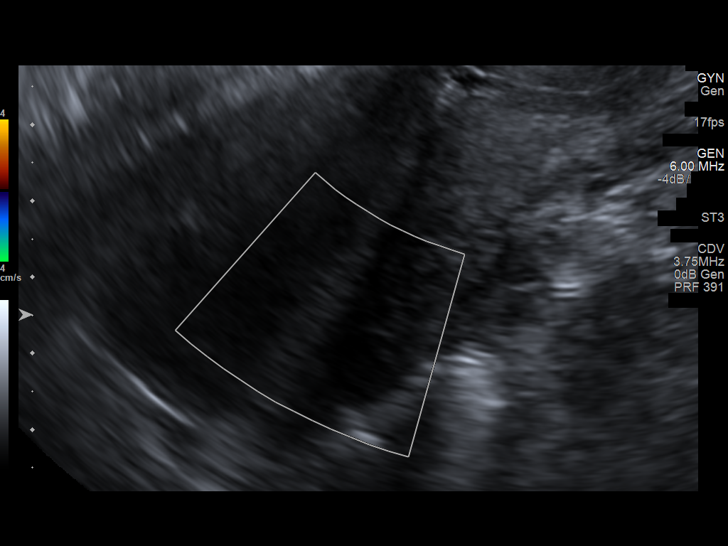
[im 50/67]
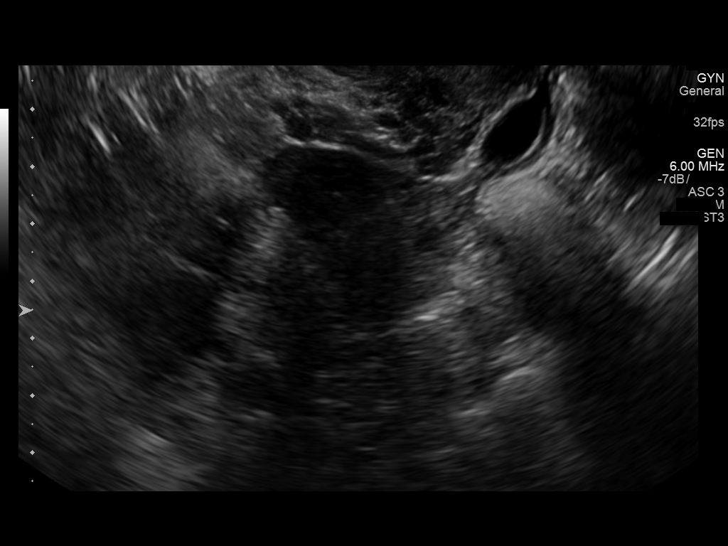
[im 56/67]
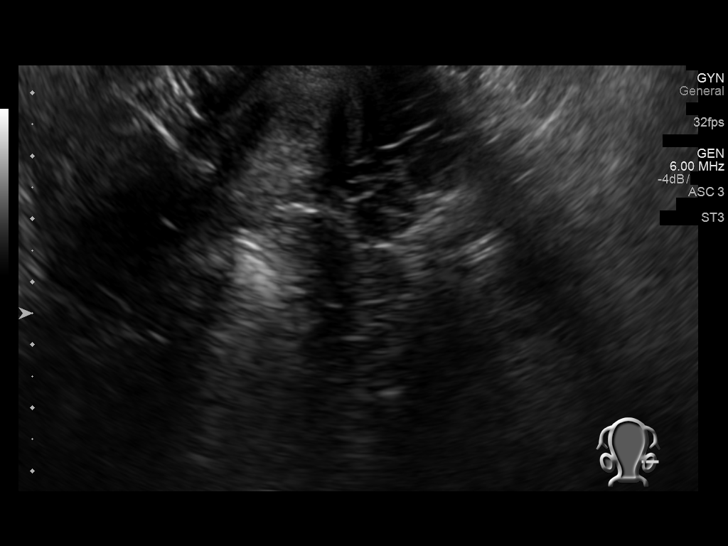
[im 61/67]
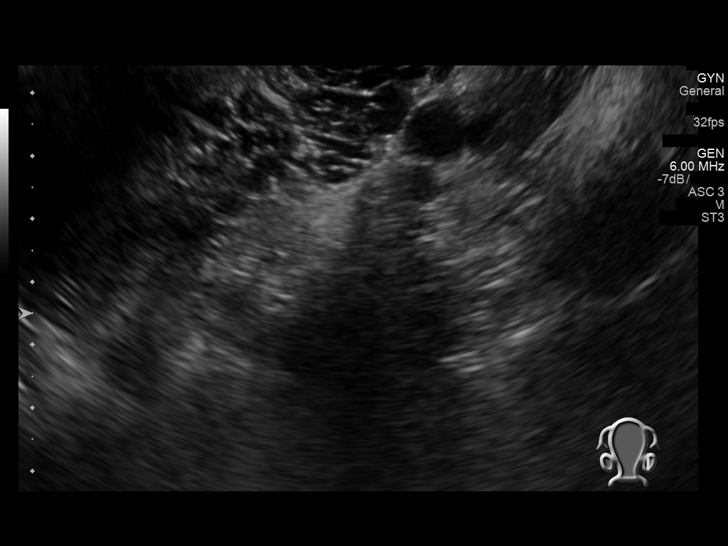
[im 67/67]
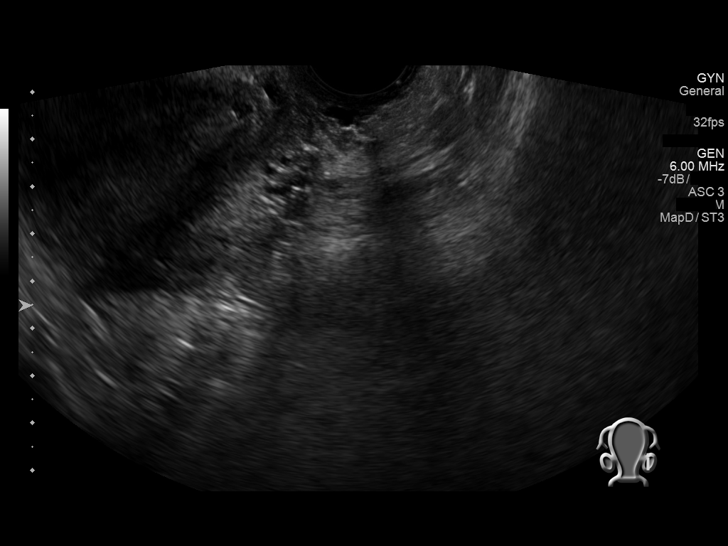

[14 of 25 positions shown; findings below may reference images not displayed]

FINDINGS: Uterus

Measurements: 10.2 x 5.2 x 5.3 cm. No fibroids or other mass
visualized. Uterus is anteverted.

Endometrium

Thickness: 8 mm.  No focal abnormality visualized.

Right ovary

Measurements: 2.8 x 2.7 x 3.0 cm.. Normal appearance/no adnexal
mass.

Left ovary

Measurements: 2.2 x 2.1 x 2.2 cm. Normal appearance/no adnexal mass.

Pulsed Doppler evaluation of both ovaries demonstrates normal
low-resistance arterial and venous waveforms.

Other findings

No abnormal free fluid.
IMPRESSION: No intrauterine or extrauterine pelvic or adnexal mass. No
inflammatory focus. No free fluid. No evidence of ovarian torsion.
